# Patient Record
Sex: Male | Born: 1975 | Hispanic: No | Marital: Married | Smoking: Never smoker
Health system: Southern US, Community
[De-identification: ages and names within clinical notes are randomized; demographics above are authoritative.]

## PROBLEM LIST (undated history)

## (undated) DIAGNOSIS — E119 Type 2 diabetes mellitus without complications: Secondary | ICD-10-CM

## (undated) DIAGNOSIS — K219 Gastro-esophageal reflux disease without esophagitis: Secondary | ICD-10-CM

## (undated) DIAGNOSIS — I10 Essential (primary) hypertension: Secondary | ICD-10-CM

## (undated) DIAGNOSIS — E785 Hyperlipidemia, unspecified: Secondary | ICD-10-CM

## (undated) HISTORY — DX: Essential (primary) hypertension: I10

## (undated) HISTORY — DX: Gastro-esophageal reflux disease without esophagitis: K21.9

## (undated) HISTORY — DX: Type 2 diabetes mellitus without complications: E11.9

## (undated) HISTORY — DX: Hyperlipidemia, unspecified: E78.5

---

## 2012-12-05 ENCOUNTER — Emergency Department (INDEPENDENT_AMBULATORY_CARE_PROVIDER_SITE_OTHER)
Admission: EM | Admit: 2012-12-05 | Discharge: 2012-12-05 | Disposition: A | Discharge status: Home / Self Care | Payer: Self-pay | Source: Home / Self Care | Attending: Family Medicine | Admitting: Family Medicine

## 2012-12-05 ENCOUNTER — Encounter (HOSPITAL_COMMUNITY): Payer: Self-pay | Admitting: *Deleted

## 2012-12-05 DIAGNOSIS — K047 Periapical abscess without sinus: Secondary | ICD-10-CM

## 2012-12-05 MED ORDER — CLINDAMYCIN HCL 300 MG PO CAPS: 300.0000 mg | ORAL_CAPSULE | Freq: Three times a day (TID) | ORAL | Status: DC

## 2012-12-05 MED ORDER — DICLOFENAC POTASSIUM 50 MG PO TABS: 50.0000 mg | ORAL_TABLET | Freq: Three times a day (TID) | ORAL | Status: DC

## 2012-12-05 NOTE — ED Notes (Signed)
Pt  Reports  r  Lower  Gum  Swelling  For  2  Days            With   Toothache         Pt  Reports  Symptoms  Not  releived  By otc  meds    Sitting upright on  Exam table  Speaking in  Complete  sentances

## 2012-12-05 NOTE — ED Provider Notes (Signed)
  CSN: [id number]     Arrival date & time 12/05/12  1010 History     First MD Initiated Contact with Patient 12/05/12 1019     Chief Complaint  Patient presents with  . Dental Pain   (Consider location/radiation/quality/duration/timing/severity/associated sxs/prior Treatment) Patient is a 37 y.o. male presenting with tooth pain. The history is provided by the patient.  Dental Pain Location:  Lower Lower teeth location:  29/RL 2nd bicuspid Quality:  Throbbing   History reviewed. No pertinent past medical history. History reviewed. No pertinent past surgical history. History reviewed. No pertinent family history. History  Substance Use Topics  . Smoking status: Never Smoker   . Smokeless tobacco: Not on file  . Alcohol Use: No    Review of Systems  Constitutional: Negative.   HENT: Positive for dental problem.     Allergies  Review of patient's allergies indicates no known allergies.  Home Medications   Current Outpatient Rx  Name  Route  Sig  Dispense  Refill  . Naproxen Sodium (ALEVE PO)   Oral   Take by mouth.         . clindamycin (CLEOCIN) 300 MG capsule   Oral   Take 1 capsule (300 mg total) by mouth 3 (three) times daily.   28 capsule   0   . diclofenac (CATAFLAM) 50 MG tablet   Oral   Take 1 tablet (50 mg total) by mouth 3 (three) times daily.   30 tablet   0    BP 123/81  Pulse 73  Temp(Src) 98.6 F (37 C) (Oral)  Resp 16  SpO2 98% Physical Exam  Nursing note and vitals reviewed. Constitutional: He is oriented to person, place, and time. He appears well-developed and well-nourished.  HENT:  Right Ear: External ear normal.  Left Ear: External ear normal.  Mouth/Throat: Oropharynx is clear and moist and mucous membranes are normal.    Neck: Normal range of motion. Neck supple.  Lymphadenopathy:    He has no cervical adenopathy.  Neurological: He is alert and oriented to person, place, and time.  Skin: Skin is warm and dry.    ED  Course   Procedures (including critical care time)  Labs Reviewed - No data to display No results found. 1. Dental abscess     MDM    Linna Hoff, MD 12/05/12 1045

## 2018-06-27 DIAGNOSIS — E785 Hyperlipidemia, unspecified: Secondary | ICD-10-CM | POA: Insufficient documentation

## 2018-06-27 DIAGNOSIS — E119 Type 2 diabetes mellitus without complications: Secondary | ICD-10-CM | POA: Insufficient documentation

## 2018-06-27 DIAGNOSIS — K429 Umbilical hernia without obstruction or gangrene: Secondary | ICD-10-CM | POA: Insufficient documentation

## 2018-10-13 DIAGNOSIS — R809 Proteinuria, unspecified: Secondary | ICD-10-CM | POA: Insufficient documentation

## 2018-12-15 DIAGNOSIS — K219 Gastro-esophageal reflux disease without esophagitis: Secondary | ICD-10-CM | POA: Insufficient documentation

## 2018-12-15 DIAGNOSIS — R112 Nausea with vomiting, unspecified: Secondary | ICD-10-CM | POA: Insufficient documentation

## 2019-03-05 DIAGNOSIS — Z8619 Personal history of other infectious and parasitic diseases: Secondary | ICD-10-CM | POA: Insufficient documentation

## 2019-03-05 DIAGNOSIS — R1013 Epigastric pain: Secondary | ICD-10-CM | POA: Insufficient documentation

## 2019-03-07 ENCOUNTER — Encounter: Payer: Self-pay | Admitting: Gastroenterology

## 2019-03-09 ENCOUNTER — Emergency Department (HOSPITAL_COMMUNITY): Payer: 59

## 2019-03-09 ENCOUNTER — Other Ambulatory Visit: Payer: Self-pay

## 2019-03-09 ENCOUNTER — Emergency Department (HOSPITAL_COMMUNITY)
Admission: EM | Admit: 2019-03-09 | Discharge: 2019-03-09 | Disposition: A | Discharge status: Home / Self Care | Payer: 59 | Attending: Emergency Medicine | Admitting: Emergency Medicine

## 2019-03-09 DIAGNOSIS — Z79899 Other long term (current) drug therapy: Secondary | ICD-10-CM | POA: Diagnosis not present

## 2019-03-09 DIAGNOSIS — R112 Nausea with vomiting, unspecified: Secondary | ICD-10-CM | POA: Insufficient documentation

## 2019-03-09 DIAGNOSIS — J189 Pneumonia, unspecified organism: Secondary | ICD-10-CM

## 2019-03-09 DIAGNOSIS — R197 Diarrhea, unspecified: Secondary | ICD-10-CM | POA: Insufficient documentation

## 2019-03-09 DIAGNOSIS — U071 COVID-19: Secondary | ICD-10-CM | POA: Diagnosis not present

## 2019-03-09 DIAGNOSIS — R1013 Epigastric pain: Secondary | ICD-10-CM | POA: Diagnosis present

## 2019-03-09 DIAGNOSIS — R10813 Right lower quadrant abdominal tenderness: Secondary | ICD-10-CM | POA: Diagnosis not present

## 2019-03-09 LAB — CBC
HCT: 41 % (ref 39.0–52.0)
Hemoglobin: 14.4 g/dL (ref 13.0–17.0)
MCH: 29.4 pg (ref 26.0–34.0)
MCHC: 35.1 g/dL (ref 30.0–36.0)
MCV: 83.8 fL (ref 80.0–100.0)
Platelets: 232 10*3/uL (ref 150–400)
RBC: 4.89 MIL/uL (ref 4.22–5.81)
RDW: 11.8 % (ref 11.5–15.5)
WBC: 6.3 10*3/uL (ref 4.0–10.5)
nRBC: 0 % (ref 0.0–0.2)

## 2019-03-09 LAB — COMPREHENSIVE METABOLIC PANEL
ALT: 15 U/L (ref 0–44)
AST: 27 U/L (ref 15–41)
Albumin: 3.3 g/dL — ABNORMAL LOW (ref 3.5–5.0)
Alkaline Phosphatase: 71 U/L (ref 38–126)
Anion gap: 16 — ABNORMAL HIGH (ref 5–15)
BUN: 12 mg/dL (ref 6–20)
CO2: 19 mmol/L — ABNORMAL LOW (ref 22–32)
Calcium: 8.5 mg/dL — ABNORMAL LOW (ref 8.9–10.3)
Chloride: 98 mmol/L (ref 98–111)
Creatinine, Ser: 0.76 mg/dL (ref 0.61–1.24)
GFR calc Af Amer: >60 mL/min (ref >60)
GFR calc non Af Amer: >60 mL/min (ref >60)
Glucose, Bld: 177 mg/dL — ABNORMAL HIGH (ref 70–99)
Potassium: 3.7 mmol/L (ref 3.5–5.1)
Sodium: 133 mmol/L — ABNORMAL LOW (ref 135–145)
Total Bilirubin: 0.9 mg/dL (ref 0.3–1.2)
Total Protein: 7 g/dL (ref 6.5–8.1)

## 2019-03-09 LAB — URINALYSIS, ROUTINE W REFLEX MICROSCOPIC
Bilirubin Urine: NEGATIVE
Glucose, UA: >=500 mg/dL — AB
Hgb urine dipstick: NEGATIVE
Ketones, ur: 20 mg/dL — AB
Leukocytes,Ua: NEGATIVE
Nitrite: NEGATIVE
Protein, ur: 100 mg/dL — AB
Specific Gravity, Urine: 1.028 (ref 1.005–1.030)
pH: 6 (ref 5.0–8.0)

## 2019-03-09 LAB — LIPASE, BLOOD: Lipase: 21 U/L (ref 11–51)

## 2019-03-09 LAB — SARS CORONAVIRUS 2 (TAT 6-24 HRS): SARS Coronavirus 2: POSITIVE — AB

## 2019-03-09 MED ORDER — AZITHROMYCIN 250 MG PO TABS: ORAL_TABLET | ORAL | 0 refills | Status: DC

## 2019-03-09 MED ORDER — IOHEXOL 300 MG/ML  SOLN
100.0000 mL | Freq: Once | INTRAMUSCULAR | Status: AC | PRN
  Administered 2019-03-09: 100 mL via INTRAVENOUS

## 2019-03-09 NOTE — ED Provider Notes (Signed)
[facility] EMERGENCY DEPARTMENT Provider Note   CSN: [id number] Arrival date & time: 03/09/19  6073     History   Chief Complaint Chief Complaint  Patient presents with   Abdominal Pain    HPI Jack Watson is a 43 y.o. male.     The history is provided by the patient. No language interpreter was used.  Abdominal Pain Pain location:  Epigastric Pain quality: aching and stabbing   Pain radiates to:  Does not radiate Pain severity:  Moderate Onset quality:  Gradual Duration:  3 days Timing:  Constant Progression:  Worsening Chronicity:  New Context: not alcohol use, not previous surgeries and not sick contacts   Relieved by:  Nothing Worsened by:  Nothing Ineffective treatments:  None tried Associated symptoms: diarrhea and vomiting   Risk factors: no alcohol abuse     No past medical history on file.  There are no active problems to display for this patient.   No past surgical history on file.      Home Medications    Prior to Admission medications   Medication Sig Start Date End Date Taking? Authorizing Provider  azithromycin (ZITHROMAX Z-PAK) 250 MG tablet Two tablets 1st day then 1 tablet a day 03/09/19   Elson Areas, PA-C  clindamycin (CLEOCIN) 300 MG capsule Take 1 capsule (300 mg total) by mouth 3 (three) times daily. 12/05/12   Linna Hoff, MD  diclofenac (CATAFLAM) 50 MG tablet Take 1 tablet (50 mg total) by mouth 3 (three) times daily. 12/05/12   Linna Hoff, MD  Naproxen Sodium (ALEVE PO) Take by mouth.    [provider]    Family History No family history on file.  Social History Social History   Tobacco Use   Smoking status: Never Smoker  Substance Use Topics   Alcohol use: No   Drug use: Not on file     Allergies   Patient has no known allergies.   Review of Systems Review of Systems  Gastrointestinal: Positive for abdominal pain, diarrhea and vomiting.  All other systems reviewed  and are negative.    Physical Exam Updated Vital Signs BP 131/87    Pulse 88    Temp 98.5 F (36.9 C) (Oral)    Resp 14    SpO2 99%   Physical Exam Vitals signs and nursing note reviewed.  Constitutional:      Appearance: He is well-developed.  HENT:     Head: Normocephalic.     Mouth/Throat:     Mouth: Mucous membranes are moist.  Eyes:     Extraocular Movements: Extraocular movements intact.  Neck:     Musculoskeletal: Normal range of motion.  Cardiovascular:     Rate and Rhythm: Normal rate.  Pulmonary:     Effort: Pulmonary effort is normal.  Abdominal:     General: There is no distension.     Palpations: Abdomen is soft.     Tenderness: There is abdominal tenderness in the right lower quadrant.  Musculoskeletal: Normal range of motion.  Skin:    General: Skin is warm.  Neurological:     General: No focal deficit present.     Mental Status: He is alert and oriented to person, place, and time.  Psychiatric:        Mood and Affect: Mood normal.      ED Treatments / Results  Labs (all labs ordered are listed, but only abnormal results are displayed) Labs Reviewed  COMPREHENSIVE METABOLIC PANEL - Abnormal; Notable for the following components:      Result Value   Sodium 133 (*)    CO2 19 (*)    Glucose, Bld 177 (*)    Calcium 8.5 (*)    Albumin 3.3 (*)    Anion gap 16 (*)    All other components within normal limits  URINALYSIS, ROUTINE W REFLEX MICROSCOPIC - Abnormal; Notable for the following components:   Color, Urine AMBER (*)    Glucose, UA >=500 (*)    Ketones, ur 20 (*)    Protein, ur 100 (*)    Bacteria, UA RARE (*)    All other components within normal limits  SARS CORONAVIRUS 2 (TAT 6-24 HRS)  LIPASE, BLOOD  CBC    EKG None  Radiology Ct Abdomen Pelvis W Contrast  Result Date: 03/09/2019 CLINICAL DATA:  Mid abdominal pain for 1 week with nausea and vomiting, diabetes mellitus EXAM: CT ABDOMEN AND PELVIS WITH CONTRAST TECHNIQUE:  Multidetector CT imaging of the abdomen and pelvis was performed using the standard protocol following bolus administration of intravenous contrast. Sagittal and coronal MPR images reconstructed from axial data set. CONTRAST:  100mL OMNIPAQUE IOHEXOL 300 MG/ML SOLN IV. No oral contrast. COMPARISON:  None FINDINGS: Lower chest: Patchy BILATERAL airspace infiltrates in the lower lungs bilaterally consistent with multifocal pneumonia. Additional subsegmental atelectasis RIGHT lower lobe. Hepatobiliary: Gallbladder and liver normal appearance Pancreas: Normal appearance Spleen: Normal appearance Adrenals/Urinary Tract: Adrenal glands, kidneys, ureters, and bladder normal appearance Stomach/Bowel: Normal appendix. Stomach and bowel loops normal appearance. Vascular/Lymphatic: Vascular structures patent.  No adenopathy. Reproductive: Minimal prostatic enlargement. Seminal vesicles unremarkable. Other: Small umbilical hernia containing fat. No free air or free fluid. No intra-abdominal or intrapelvic inflammatory process seen. Musculoskeletal: No acute osseous findings. IMPRESSION: No acute intra-abdominal or intrapelvic abnormalities. Patchy BILATERAL ill-defined airspace infiltrates in the lower lungs bilaterally consistent with multifocal pneumonia; consider atypical and viral etiologies. Findings called to Cheron SchaumannLeslie Constance Hackenberg PA on 03/09/2019 at 1255 hours. Electronically Signed   By: Ulyses SouthwardMark  Boles M.D.   On: 03/09/2019 12:55   Dg Chest Port 1 View  Result Date: 03/09/2019 CLINICAL DATA:  COVID EXAM: PORTABLE CHEST 1 VIEW COMPARISON:  None. FINDINGS: Patchy consolidation is present, greatest at the lung bases. No pleural effusion or pneumothorax. Heart size is likely within normal limits for portable technique. No acute osseous abnormality. IMPRESSION: Bilateral opacities suspicious for pneumonia. Electronically Signed   By: Guadlupe SpanishPraneil  Patel M.D.   On: 03/09/2019 13:40    Procedures Procedures (including critical care  time)  Medications Ordered in ED Medications  iohexol (OMNIPAQUE) 300 MG/ML solution 100 mL (100 mLs Intravenous Contrast Given 03/09/19 1219)     Initial Impression / Assessment and Plan / ED Course  I have reviewed the triage vital signs and the nursing notes.  Pertinent labs & imaging results that were available during my care of the patient were reviewed by me and considered in my medical decision making (see chart for details).        MDM  Pct scan shows possible pneumonia,  Portable chest xray shows pneumonia.  Xrays reviewed, interpreted and discussed with pt.  Pt counseled on probable covid infection.   Jack Watson was evaluated in Emergency Department on 03/09/2019 for the symptoms described in the history of present illness. He was evaluated in the context of the global COVID-19 pandemic, which necessitated consideration that the patient might be at risk for infection with the SARS-CoV-2 virus that  causes COVID-19. Institutional protocols and algorithms that pertain to the evaluation of patients at risk for COVID-19 are in a state of rapid change based on information released by regulatory bodies including the CDC and federal and state organizations. These policies and algorithms were followed during the patient's care in the ED.  Final Clinical Impressions(s) / ED Diagnoses   Final diagnoses:  Community acquired pneumonia, unspecified laterality    ED Discharge Orders         Ordered    azithromycin (ZITHROMAX Z-PAK) 250 MG tablet     03/09/19 1401        An After Visit Summary was printed and given to the patient.    Fransico Meadow, [place] 03/09/19 1436    Gareth Morgan, MD 03/10/19 2235

## 2019-03-09 NOTE — Discharge Instructions (Addendum)
Return if any problems.

## 2019-03-09 NOTE — ED Notes (Signed)
Patient transported to CT 

## 2019-03-09 NOTE — ED Triage Notes (Signed)
Pt here for evaluation of one week of upper abdominal pain and vomiting. Pt having yellow vomit and no diarrhea. Pt prescribed protonix and zofran without relief. Trouble keeping any food down.

## 2019-03-09 NOTE — ED Notes (Signed)
Patient verbalizes understanding of discharge instructions . Opportunity for questions and answers were provided . Armband removed by staff ,Pt discharged from ED. W/C  offered at D/C  and Declined W/C at D/C and was escorted to lobby by RN.  

## 2019-03-12 ENCOUNTER — Telehealth (HOSPITAL_COMMUNITY): Payer: Self-pay

## 2019-03-26 ENCOUNTER — Other Ambulatory Visit: Payer: Self-pay

## 2019-03-26 ENCOUNTER — Encounter: Payer: Self-pay | Admitting: Gastroenterology

## 2019-03-26 ENCOUNTER — Ambulatory Visit (INDEPENDENT_AMBULATORY_CARE_PROVIDER_SITE_OTHER): Payer: 59 | Admitting: Gastroenterology

## 2019-03-26 VITALS — BP 100/66 | HR 107 | Temp 98.0°F | Ht 65.0 in | Wt 142.5 lb

## 2019-03-26 DIAGNOSIS — Z800 Family history of malignant neoplasm of digestive organs: Secondary | ICD-10-CM

## 2019-03-26 DIAGNOSIS — K219 Gastro-esophageal reflux disease without esophagitis: Secondary | ICD-10-CM | POA: Diagnosis not present

## 2019-03-26 DIAGNOSIS — R1013 Epigastric pain: Secondary | ICD-10-CM | POA: Diagnosis not present

## 2019-03-26 MED ORDER — PANTOPRAZOLE SODIUM 40 MG PO TBEC: DELAYED_RELEASE_TABLET | ORAL | 3 refills | Status: AC

## 2019-03-26 NOTE — Progress Notes (Signed)
Chief Complaint: Abdominal pain, history of GERD  Referring Provider:     Sandre Kitty, PA-C  HPI:    Jack Watson is a 43 y.o. male with a history of diabetes, dyslipidemia, referred to the [facility] for evaluation of abdominal pain and reflux.  He presents today with his son who assists in translating.  He describes midepigastric pain and n/v which started approximately 4 weeks ago. Was treated Prilosec bid x1 week and antiemetics, with improvement, and has since stopped these. Sxs have continued to improve a bit over last week or so, despite being off PPI therapy.  No pain currently.  No associated diarrhea, constipation, hematochezia. No weight loss.    Reflux symptoms described as sour brash and regurgitation.  Treated with omeprazole 20 mg/day in 11/2018, but cannot recall efficacy.  Stopped taking medication on his own, but again on PPI last month as above x1 week with improvement.  No longer taking any acid suppression therapy.  Back to having near daily reflux symptoms, worse in the morning, but now can occur throughout the day.  No nocturnal symptoms.  No dysphagia.   Symptoms improved with emesis.  Was last seen by his PCM for this issue in 02/2019 and referred to GI.  Hemoglobin A1c 8.3% in 01/2019.  Was seen in the ER for CAP and Covid positive last month, treated with azithromycin.  Normal liver enzymes and renal function.  Normal CBC.  CT abdomen/pelvis essentially normal aside from patchy bilateral lower lung infiltrates. Covid retest last week reportedly negative.   No previous EGD or colonoscopy.   Father deceased from stomach CA 10 years ago.  Otherwise, no known family history of CRC, liver disease, pancreatic disease, or IBD.    Past Medical History:  Diagnosis Date  . Diabetes (HCC)   . Hypertension      History reviewed. No pertinent surgical history. Family History  Problem Relation Age of Onset  . Stomach cancer Paternal  Grandfather   . Colon cancer Neg Hx   . Esophageal cancer Neg Hx    Social History   Tobacco Use  . Smoking status: Never Smoker  . Smokeless tobacco: Never Used  Substance Use Topics  . Alcohol use: Not Currently    Comment: ocassionally  . Drug use: Never   Current Outpatient Medications  Medication Sig Dispense Refill  . lisinopril (ZESTRIL) 2.5 MG tablet Take 2.5 mg by mouth daily.    . metFORMIN (GLUCOPHAGE) 1000 MG tablet Take 1,000 mg by mouth 2 (two) times daily.    . Naproxen Sodium (ALEVE PO) Take by mouth.    . STEGLATRO 5 MG TABS Take 1 tablet by mouth every morning.     No current facility-administered medications for this visit.    No Known Allergies   Review of Systems: All systems reviewed and negative except where noted in HPI.     Physical Exam:    Wt Readings from Last 3 Encounters:  03/26/19 142 lb 8 oz (64.6 kg)    BP 100/66   Pulse (!) 107   Temp 98 F (36.7 C)   Ht 5\' 5"  (1.651 m)   Wt 142 lb 8 oz (64.6 kg)   BMI 23.71 kg/m  Constitutional:  Pleasant, in no acute distress. Psychiatric: Normal mood and affect. Behavior is normal. EENT: Pupils normal.  Conjunctivae are normal. No scleral icterus. Neck supple. No cervical LAD. Cardiovascular: Normal rate,  regular rhythm. No edema Pulmonary/chest: Effort normal and breath sounds normal. No wheezing, rales or rhonchi. Abdominal: Soft, nondistended, nontender. Bowel sounds active throughout. There are no masses palpable. No hepatomegaly. Neurological: Alert and oriented to person place and time. Skin: Skin is warm and dry. No rashes noted.   ASSESSMENT AND PLAN;   1) GERD 2) Regurgitation - Offered EGD- declined - Protonix 40 mg PO BID x4 weeks for diagnostic/therapeutic intent.  If symptoms resolved, plan to transition to 40 mg/day and eventually wean to lowest effective dose -Discussed antireflux lifestyle/dietary modifications  3) FHx of Gastric CA - As above, offered EGD, which he  declined  4) Epigastric pain- resolved now.  Discussed EGD and/or H. pylori testing.  Declined for now   Lavena Bullion, DO, FACG  03/26/2019, 2:40 PM   Cathleen Corti, PA-C

## 2019-03-26 NOTE — Patient Instructions (Signed)
We have sent the following medications to your pharmacy for you to pick up at your convenience: Protonix  It was a pleasure to see you today!  Vito Cirigliano, D.O.  

## 2019-04-17 ENCOUNTER — Telehealth: Payer: Self-pay | Admitting: Gastroenterology

## 2019-04-17 DIAGNOSIS — R112 Nausea with vomiting, unspecified: Secondary | ICD-10-CM

## 2019-04-17 DIAGNOSIS — R1013 Epigastric pain: Secondary | ICD-10-CM

## 2019-04-17 NOTE — Telephone Encounter (Signed)
Unclear if his persistent symptoms are due to under controlled reflux or if he has a primary gastric issue (gastritis, PUD, etc).  Additionally, he had Covid in 02/2019.  A high percentage of Covid patients present with GI symptoms, and is certainly possible that the prolonged course is related to that, there is just not enough data to answer that question clearly.  Either way, given the persistent symptoms despite high-dose PPI, his family history of gastric CA, I think it is reasonable to proceed with EGD at this time (we discussed this in the clinic during his last appointment, and he wanted to give medications a try first).  If he agrees, can schedule for EGD in my next available appointment.  In the meantime, okay to use Zofran 4 mg po prn every 4-6 hours for nausea/vomiting, #30, RF1.  Thanks.

## 2019-04-17 NOTE — Telephone Encounter (Signed)
Pt called to inform that he has continued experiencing vomiting despite the medication that he has been put on. He stated that since beginning his treatment vomiting calmed down a little bit. However, since the past two weeks he has experiencing it intermittently. He said that this morning vomit had a yellowish color. He still has two more week of treatment but states that Dr. Bryan Lemma told him to call if sxs persisted. Pls call him.

## 2019-04-18 NOTE — Telephone Encounter (Signed)
Called and spoke with patient-patient requests a spanish interpreter  Please call this patient and have him scheduled for a previsit appt and and endo appt; please also let him know that I am sending in the RX for the nausea medicine-please have him confirm the pharmacy he wants this sent to and let me know so I can send in the RX; Thank you Bre

## 2019-04-24 MED ORDER — ONDANSETRON HCL 4 MG PO TABS: 4.0000 mg | ORAL_TABLET | ORAL | 1 refills | Status: DC | PRN

## 2019-04-24 NOTE — Telephone Encounter (Signed)
Fyi, Pt called. [facility] uses [facility] on [address]. I scheduled him for egd on 05/23/19 at 8:30am at [facility] and PV on 05/09/19 at 3:30pm.

## 2019-04-24 NOTE — Telephone Encounter (Signed)
RX sent to pharmacy  

## 2019-04-25 ENCOUNTER — Encounter: Payer: Self-pay | Admitting: Gastroenterology

## 2019-05-09 ENCOUNTER — Ambulatory Visit (AMBULATORY_SURGERY_CENTER): Payer: Self-pay | Admitting: *Deleted

## 2019-05-09 ENCOUNTER — Other Ambulatory Visit: Payer: Self-pay

## 2019-05-09 VITALS — Temp 97.5°F | Ht 65.0 in | Wt 154.2 lb

## 2019-05-09 DIAGNOSIS — K219 Gastro-esophageal reflux disease without esophagitis: Secondary | ICD-10-CM

## 2019-05-09 NOTE — Progress Notes (Signed)
Interpreter used today at the [facility] for this pt.  Interpreter's name is- Thayer Ohm, pt's son.  His temperature- 97.6  Pt had covid 19 on 03-09-19- no need for repeat covid test d/t being within the last 3 months  Pt is aware that care partner will wait in the car during procedure; if they feel like they will be too hot or cold to wait in the car; they may wait in the 4 th floor lobby. Patient is aware to bring only one care partner. We want them to wear a mask (we do not have any that we can provide them), practice social distancing, and we will check their temperatures when they get here.  I did remind the patient that their care partner needs to stay in the parking lot the entire time and have a cell phone available, we will call them when the pt is ready for discharge. Patient will wear mask into building.  No egg or soy allergy  No home oxygen use   No trouble moving neck  No medications for weight loss taken  emmi information given  Son states that pt's wife will be with him the day of the procedure and will need an intepretor

## 2019-05-16 ENCOUNTER — Encounter: Payer: Self-pay | Admitting: Gastroenterology

## 2019-05-23 ENCOUNTER — Other Ambulatory Visit: Payer: Self-pay

## 2019-05-23 ENCOUNTER — Ambulatory Visit (AMBULATORY_SURGERY_CENTER): Payer: 59 | Admitting: Gastroenterology

## 2019-05-23 ENCOUNTER — Encounter: Payer: Self-pay | Admitting: Gastroenterology

## 2019-05-23 VITALS — BP 116/72 | HR 72 | Temp 96.6°F | Resp 16 | Ht 65.0 in | Wt 154.0 lb

## 2019-05-23 DIAGNOSIS — K2950 Unspecified chronic gastritis without bleeding: Secondary | ICD-10-CM

## 2019-05-23 DIAGNOSIS — B9681 Helicobacter pylori [H. pylori] as the cause of diseases classified elsewhere: Secondary | ICD-10-CM

## 2019-05-23 DIAGNOSIS — K2970 Gastritis, unspecified, without bleeding: Secondary | ICD-10-CM | POA: Insufficient documentation

## 2019-05-23 DIAGNOSIS — K2990 Gastroduodenitis, unspecified, without bleeding: Secondary | ICD-10-CM | POA: Insufficient documentation

## 2019-05-23 DIAGNOSIS — R112 Nausea with vomiting, unspecified: Secondary | ICD-10-CM

## 2019-05-23 DIAGNOSIS — Z800 Family history of malignant neoplasm of digestive organs: Secondary | ICD-10-CM

## 2019-05-23 DIAGNOSIS — R1011 Right upper quadrant pain: Secondary | ICD-10-CM

## 2019-05-23 DIAGNOSIS — R1013 Epigastric pain: Secondary | ICD-10-CM

## 2019-05-23 DIAGNOSIS — K219 Gastro-esophageal reflux disease without esophagitis: Secondary | ICD-10-CM

## 2019-05-23 MED ORDER — SODIUM CHLORIDE 0.9 % IV SOLN: 500.0000 mL | Freq: Once | INTRAVENOUS | Status: DC

## 2019-05-23 NOTE — Progress Notes (Signed)
Report given to PACU, vss 

## 2019-05-23 NOTE — Progress Notes (Signed)
Called to room to assist during endoscopic procedure.  Patient ID and intended procedure confirmed with present staff. Received instructions for my participation in the procedure from the performing physician.  

## 2019-05-23 NOTE — Progress Notes (Signed)
Attempted to schedule next EGD for 8 weeks but schedule was not open.  Please call patient to schedule.

## 2019-05-23 NOTE — Progress Notes (Signed)
Pt's states no medical or surgical changes since previsit or office visit.  CW vitals, JB temps and AW IV. Marta Col Interpreter assisted with obtaining medical hx and interview.

## 2019-05-23 NOTE — Patient Instructions (Signed)
Await pathology results.  Information on gastritis given to you today.  Resume Protonix at 40 mg twice a day for 8 weeks.  Repeat upper endoscopy in 8 weeks.  YOU HAD AN ENDOSCOPIC PROCEDURE TODAY AT THE [facility]:   Refer to the procedure report that was given to you for any specific questions about what was found during the examination.  If the procedure report does not answer your questions, please call your gastroenterologist to clarify.  If you requested that your care partner not be given the details of your procedure findings, then the procedure report has been included in a sealed envelope for you to review at your convenience later.  YOU SHOULD EXPECT: Some feelings of bloating in the abdomen. Passage of more gas than usual.  Walking can help get rid of the air that was put into your GI tract during the procedure and reduce the bloating. If you had a lower endoscopy (such as a colonoscopy or flexible sigmoidoscopy) you may notice spotting of blood in your stool or on the toilet paper. If you underwent a bowel prep for your procedure, you may not have a normal bowel movement for a few days.  Please Note:  You might notice some irritation and congestion in your nose or some drainage.  This is from the oxygen used during your procedure.  There is no need for concern and it should clear up in a day or so.  SYMPTOMS TO REPORT IMMEDIATELY:     Following upper endoscopy (EGD)  Vomiting of blood or coffee ground material  New chest pain or pain under the shoulder blades  Painful or persistently difficult swallowing  New shortness of breath  Fever of 100F or higher  Black, tarry-looking stools  For urgent or emergent issues, a gastroenterologist can be reached at any hour by calling [phone].   DIET:  We do recommend a small meal at first, but then you may proceed to your regular diet.  Drink plenty of fluids but you should avoid alcoholic beverages for 24  hours.  ACTIVITY:  You should plan to take it easy for the rest of today and you should NOT DRIVE or use heavy machinery until tomorrow (because of the sedation medicines used during the test).    FOLLOW UP: Our staff will call the number listed on your records 48-72 hours following your procedure to check on you and address any questions or concerns that you may have regarding the information given to you following your procedure. If we do not reach you, we will leave a message.  We will attempt to reach you two times.  During this call, we will ask if you have developed any symptoms of COVID 19. If you develop any symptoms (ie: fever, flu-like symptoms, shortness of breath, cough etc.) before then, please call [phone].  If you test positive for Covid 19 in the 2 weeks post procedure, please call and report this information to [place].    If any biopsies were taken you will be contacted by phone or by letter within the next 1-3 weeks.  Please call us at [phone] if you have not heard about the biopsies in 3 weeks.    SIGNATURES/CONFIDENTIALITY: You and/or your care partner have signed paperwork which will be entered into your electronic medical record.  These signatures attest to the fact that that the information above on your After Visit Summary has been reviewed and is understood.  Full responsibility of the confidentiality  of this discharge information lies with you and/or your care-partner.

## 2019-05-23 NOTE — Op Note (Signed)
[facility] Patient Name: Jack Watson Procedure Date: 05/23/2019 8:30 AM MRN: [id number] Endoscopist: Doristine Locks , MD Age: 44 Referring MD:  Date of Birth: 1976/01/09 Gender: Male Account #: [id number] Procedure:                Upper GI endoscopy Indications:              Epigastric abdominal pain, Abdominal pain in the                            right upper quadrant, Suspected esophageal reflux,                            Nausea with vomiting Medicines:                Monitored Anesthesia Care Procedure:                Pre-Anesthesia Assessment:                           - Prior to the procedure, a History and Physical                            was performed, and patient medications and                            allergies were reviewed. The patient's tolerance of                            previous anesthesia was also reviewed. The risks                            and benefits of the procedure and the sedation                            options and risks were discussed with the patient.                            All questions were answered, and informed consent                            was obtained. Prior Anticoagulants: The patient has                            taken no previous anticoagulant or antiplatelet                            agents. ASA Grade Assessment: II - A patient with                            mild systemic disease. After reviewing the risks                            and benefits, the patient was deemed in  satisfactory condition to undergo the procedure.                           After obtaining informed consent, the endoscope was                            passed under direct vision. Throughout the                            procedure, the patient's blood pressure, pulse, and                            oxygen saturations were monitored continuously. The                            Endoscope was introduced through  the mouth, and                            advanced to the second part of duodenum. The upper                            GI endoscopy was accomplished without difficulty.                            The patient tolerated the procedure well. Scope In: Scope Out: Findings:                 The examined esophagus was normal.                           The Z-line was regular and was found 40 cm from the                            incisors.                           Localized moderate inflammation characterized by                            congestion (edema) and erythema was found in the                            gastric fundus. Biopsies were taken with a cold                            forceps for histology. Estimated blood loss was                            minimal.                           Scattered mild inflammation characterized by                            erythema was found in the gastric body and in the  gastric antrum. Biopsies were taken with a cold                            forceps for Helicobacter pylori testing. Estimated                            blood loss was minimal.                           The duodenal bulb, first portion of the duodenum                            and second portion of the duodenum were normal. Complications:            No immediate complications. Estimated Blood Loss:     Estimated blood loss was minimal. Impression:               - Normal esophagus.                           - Z-line regular, 40 cm from the incisors.                           - Gastritis. Biopsied.                           - Gastritis. Biopsied.                           - Normal duodenal bulb, first portion of the                            duodenum and second portion of the duodenum. Recommendation:           - Patient has a contact number available for                            emergencies. The signs and symptoms of potential                             delayed complications were discussed with the                            patient. Return to normal activities tomorrow.                            Written discharge instructions were provided to the                            patient.                           - Resume previous diet.                           - Await pathology results.                           -  Resume Protonix (pantoprazole) at 40 mg PO BID                            for 8 weeks.                           - Repeat upper endoscopy in 8 weeks to check                            healing. Doristine Locks, MD 05/23/2019 8:58:55 AM

## 2019-05-25 ENCOUNTER — Telehealth: Payer: Self-pay | Admitting: *Deleted

## 2019-05-25 NOTE — Telephone Encounter (Signed)
  Follow up Call-  Call back number 05/23/2019  Post procedure Call Back phone  # [phone]  Permission to leave phone message Yes  Some recent data might be hidden     Patient questions:  Do you have a fever, pain , or abdominal swelling? No. Pain Score  0 *  Have you tolerated food without any problems? Yes.    Have you been able to return to your normal activities? Yes.    Do you have any questions about your discharge instructions: Diet   No. Medications  No. Follow up visit  No.  Do you have questions or concerns about your Care? No.  Actions: * If pain score is 4 or above: No action needed, pain <4.  1. Have you developed a fever since your procedure? no  2.   Have you had an respiratory symptoms (SOB or cough) since your procedure? no  3.   Have you tested positive for COVID 19 since your procedure no  4.   Have you had any family members/close contacts diagnosed with the COVID 19 since your procedure?  no   If yes to any of these questions please route to Laverna Peace, RN and Jennye Boroughs, Charity fundraiser.

## 2019-05-28 NOTE — Telephone Encounter (Signed)
Patient returned call and states he is well

## 2019-05-29 ENCOUNTER — Other Ambulatory Visit: Payer: Self-pay | Admitting: Gastroenterology

## 2019-05-29 MED ORDER — DOXYCYCLINE HYCLATE 100 MG PO TABS: 100.0000 mg | ORAL_TABLET | Freq: Two times a day (BID) | ORAL | 0 refills | Status: AC

## 2019-05-29 MED ORDER — OMEPRAZOLE 40 MG PO CPDR: 40.0000 mg | DELAYED_RELEASE_CAPSULE | Freq: Two times a day (BID) | ORAL | 0 refills | Status: AC

## 2019-05-29 MED ORDER — BISMUTH SUBSALICYLATE 262 MG PO CHEW: 524.0000 mg | CHEWABLE_TABLET | Freq: Four times a day (QID) | ORAL | 0 refills | Status: AC

## 2019-05-29 MED ORDER — METRONIDAZOLE 250 MG PO TABS: 250.0000 mg | ORAL_TABLET | Freq: Four times a day (QID) | ORAL | 0 refills | Status: AC

## 2019-06-09 DIAGNOSIS — E1142 Type 2 diabetes mellitus with diabetic polyneuropathy: Secondary | ICD-10-CM | POA: Insufficient documentation

## 2019-06-09 DIAGNOSIS — B353 Tinea pedis: Secondary | ICD-10-CM | POA: Insufficient documentation

## 2019-07-05 ENCOUNTER — Encounter: Payer: Self-pay | Admitting: Gastroenterology

## 2019-07-18 ENCOUNTER — Other Ambulatory Visit: Payer: Self-pay

## 2019-07-18 ENCOUNTER — Ambulatory Visit (AMBULATORY_SURGERY_CENTER): Payer: Self-pay | Admitting: *Deleted

## 2019-07-18 VITALS — Temp 97.8°F | Ht 65.0 in | Wt 154.0 lb

## 2019-07-18 DIAGNOSIS — K2970 Gastritis, unspecified, without bleeding: Secondary | ICD-10-CM

## 2019-07-18 NOTE — Progress Notes (Signed)

## 2019-07-18 NOTE — Progress Notes (Signed)
Patient assisted by interpretor Maretta Los

## 2019-08-01 ENCOUNTER — Encounter: Payer: Self-pay | Admitting: Gastroenterology

## 2019-08-03 ENCOUNTER — Ambulatory Visit (AMBULATORY_SURGERY_CENTER): Payer: 59 | Admitting: Gastroenterology

## 2019-08-03 ENCOUNTER — Encounter: Payer: Self-pay | Admitting: Gastroenterology

## 2019-08-03 ENCOUNTER — Other Ambulatory Visit: Payer: Self-pay

## 2019-08-03 VITALS — BP 100/67 | HR 64 | Temp 96.6°F | Resp 19 | Ht 65.0 in | Wt 154.0 lb

## 2019-08-03 DIAGNOSIS — B9681 Helicobacter pylori [H. pylori] as the cause of diseases classified elsewhere: Secondary | ICD-10-CM | POA: Diagnosis not present

## 2019-08-03 DIAGNOSIS — K2970 Gastritis, unspecified, without bleeding: Secondary | ICD-10-CM

## 2019-08-03 DIAGNOSIS — K2950 Unspecified chronic gastritis without bleeding: Secondary | ICD-10-CM | POA: Diagnosis not present

## 2019-08-03 DIAGNOSIS — K3189 Other diseases of stomach and duodenum: Secondary | ICD-10-CM | POA: Diagnosis not present

## 2019-08-03 DIAGNOSIS — A048 Other specified bacterial intestinal infections: Secondary | ICD-10-CM

## 2019-08-03 DIAGNOSIS — K219 Gastro-esophageal reflux disease without esophagitis: Secondary | ICD-10-CM

## 2019-08-03 DIAGNOSIS — R1013 Epigastric pain: Secondary | ICD-10-CM

## 2019-08-03 HISTORY — PX: UPPER GASTROINTESTINAL ENDOSCOPY: SHX188

## 2019-08-03 MED ORDER — SODIUM CHLORIDE 0.9 % IV SOLN: 500.0000 mL | Freq: Once | INTRAVENOUS | Status: AC

## 2019-08-03 MED ORDER — SODIUM CHLORIDE 0.9 % IV SOLN: 500.0000 mL | Freq: Once | INTRAVENOUS | Status: DC

## 2019-08-03 NOTE — Progress Notes (Signed)
Pt's states no medical or surgical changes since previsit or office visit.  Temp LC Vitals CW 

## 2019-08-03 NOTE — Op Note (Signed)
[facility] Patient Name: Jack Watson Procedure Date: 08/03/2019 10:07 AM MRN: [id number] Endoscopist: Doristine Locks , MD Age: 44 Referring MD:  Date of Birth: [birth year] Gender: Male Account #: [id number] Procedure:                Upper GI endoscopy Indications:              Follow-up of Helicobacter pylori, Follow-up of                            gastritis                           EGD on 05/23/19 with H pylori gastritis and a focal                            area of moderate gastritis in the gastric fundus,                            treated with high dose PPI x8 weeks along with                            quadruple therapy for H pylori eradication,                            presents today for EGD to evaluate for appropriate                            mucosal healing and H pylori eradication. Medicines:                Monitored Anesthesia Care Procedure:                Pre-Anesthesia Assessment:                           - Prior to the procedure, a History and Physical                            was performed, and patient medications and                            allergies were reviewed. The patient's tolerance of                            previous anesthesia was also reviewed. The risks                            and benefits of the procedure and the sedation                            options and risks were discussed with the patient.                            All questions were answered, and informed consent  was obtained. Prior Anticoagulants: The patient has                            taken no previous anticoagulant or antiplatelet                            agents. ASA Grade Assessment: II - A patient with                            mild systemic disease. After reviewing the risks                            and benefits, the patient was deemed in                            satisfactory condition to undergo the procedure.                            After obtaining informed consent, the endoscope was                            passed under direct vision. Throughout the                            procedure, the patient's blood pressure, pulse, and                            oxygen saturations were monitored continuously. The                            Endoscope was introduced through the mouth, and                            advanced to the second part of duodenum. The upper                            GI endoscopy was accomplished without difficulty.                            The patient tolerated the procedure well. Scope In: Scope Out: Findings:                 The examined esophagus was normal.                           The Z-line was regular and was found 36 cm from the                            incisors.                           Localized area of moderate inflammation                            characterized by congestion (edema)  and erythema                            was found in the gastric fundus. This was only                            slightly improved from previous. Multiple mucosal                            biopsies were taken with a cold forceps for                            histology. Estimated blood loss was minimal.                           Scattered pockets of minimal inflammation                            characterized by erythema was found in the gastric                            body and in the gastric antrum. This was improved                            from previous. Biopsies were taken with a cold                            forceps for Helicobacter pylori testing. Estimated                            blood loss was minimal.                           The duodenal bulb, first portion of the duodenum                            and second portion of the duodenum were normal. Complications:            No immediate complications. Estimated Blood Loss:     Estimated blood loss was  minimal. Impression:               - Normal esophagus.                           - Z-line regular, 36 cm from the incisors.                           - Gastritis. Biopsied.                           - Gastritis. Biopsied.                           - Normal duodenal bulb, first portion of the  duodenum and second portion of the duodenum. Recommendation:           - Patient has a contact number available for                            emergencies. The signs and symptoms of potential                            delayed complications were discussed with the                            patient. Return to normal activities tomorrow.                            Written discharge instructions were provided to the                            patient.                           - Resume previous diet.                           - Continue present medications.                           - Await pathology results.                           - Return to GI clinic in 6 weeks. Gerrit Heck, MD 08/03/2019 10:49:38 AM

## 2019-08-03 NOTE — Progress Notes (Signed)
Called to room to assist during endoscopic procedure.  Patient ID and intended procedure confirmed with present staff. Received instructions for my participation in the procedure from the performing physician.  

## 2019-08-03 NOTE — Progress Notes (Signed)
PT taken to PACU. Monitors in place. VSS. Report given to RN. 

## 2019-08-03 NOTE — Patient Instructions (Signed)
YOU HAD AN ENDOSCOPIC PROCEDURE TODAY AT THE [facility]:   Refer to the procedure report that was given to you for any specific questions about what was found during the examination.  If the procedure report does not answer your questions, please call your gastroenterologist to clarify.  If you requested that your care partner not be given the details of your procedure findings, then the procedure report has been included in a sealed envelope for you to review at your convenience later.  YOU SHOULD EXPECT: Some feelings of bloating in the abdomen. Passage of more gas than usual.  Walking can help get rid of the air that was put into your GI tract during the procedure and reduce the bloating. If you had a lower endoscopy (such as a colonoscopy or flexible sigmoidoscopy) you may notice spotting of blood in your stool or on the toilet paper. If you underwent a bowel prep for your procedure, you may not have a normal bowel movement for a few days.  Please Note:  You might notice some irritation and congestion in your nose or some drainage.  This is from the oxygen used during your procedure.  There is no need for concern and it should clear up in a day or so.  SYMPTOMS TO REPORT IMMEDIATELY:     Following upper endoscopy (EGD)  Vomiting of blood or coffee ground material  New chest pain or pain under the shoulder blades  Painful or persistently difficult swallowing  New shortness of breath  Fever of 100F or higher  Black, tarry-looking stools  For urgent or emergent issues, a gastroenterologist can be reached at any hour by calling [phone]. Do not use MyChart messaging for urgent concerns.    DIET:  We do recommend a small meal at first, but then you may proceed to your regular diet.  Drink plenty of fluids but you should avoid alcoholic beverages for 24 hours.  ACTIVITY:  You should plan to take it easy for the rest of today and you should NOT DRIVE or use heavy machinery  until tomorrow (because of the sedation medicines used during the test).    FOLLOW UP: Our staff will call the number listed on your records 48-72 hours following your procedure to check on you and address any questions or concerns that you may have regarding the information given to you following your procedure. If we do not reach you, we will leave a message.  We will attempt to reach you two times.  During this call, we will ask if you have developed any symptoms of COVID 19. If you develop any symptoms (ie: fever, flu-like symptoms, shortness of breath, cough etc.) before then, please call [phone].  If you test positive for Covid 19 in the 2 weeks post procedure, please call and report this information to [place].    If any biopsies were taken you will be contacted by phone or by letter within the next 1-3 weeks.  Please call us at [phone] if you have not heard about the biopsies in 3 weeks.    SIGNATURES/CONFIDENTIALITY: You and/or your care partner have signed paperwork which will be entered into your electronic medical record.  These signatures attest to the fact that that the information above on your After Visit Summary has been reviewed and is understood.  Full responsibility of the confidentiality of this discharge information lies with you and/or your care-partner.  Resume medications. Follow up in clinic in 6 weeks.

## 2019-08-07 ENCOUNTER — Telehealth: Payer: Self-pay

## 2019-08-07 NOTE — Telephone Encounter (Signed)
Left message on answering machine. 

## 2019-08-07 NOTE — Telephone Encounter (Signed)
Attempted to reach patient for post-procedure f/u call. No answer. Left message for him to please not hesitate to call us if he has any questions/concerns regarding his care. 

## 2019-08-13 ENCOUNTER — Other Ambulatory Visit: Payer: Self-pay | Admitting: Gastroenterology

## 2019-08-13 DIAGNOSIS — B9681 Helicobacter pylori [H. pylori] as the cause of diseases classified elsewhere: Secondary | ICD-10-CM

## 2019-08-13 MED ORDER — OMEPRAZOLE 20 MG PO CPDR: 20.0000 mg | DELAYED_RELEASE_CAPSULE | Freq: Two times a day (BID) | ORAL | 0 refills | Status: AC

## 2019-08-13 MED ORDER — METRONIDAZOLE 500 MG PO TABS
500.0000 mg | ORAL_TABLET | Freq: Two times a day (BID) | ORAL | 0 refills | Status: AC
Start: 2019-08-13 — End: ?

## 2019-08-13 MED ORDER — CLARITHROMYCIN 500 MG PO TABS: 500.0000 mg | ORAL_TABLET | Freq: Two times a day (BID) | ORAL | 0 refills | Status: AC

## 2019-08-13 MED ORDER — AMOXICILLIN 500 MG PO CAPS
1000.0000 mg | ORAL_CAPSULE | Freq: Three times a day (TID) | ORAL | 0 refills | Status: AC
Start: 2019-08-13 — End: ?

## 2019-08-17 ENCOUNTER — Telehealth: Payer: Self-pay | Admitting: Gastroenterology

## 2019-08-17 NOTE — Telephone Encounter (Signed)
Jack Watson will try to contact patient to ask him a few questions regarding how he is taking his medication

## 2019-08-17 NOTE — Telephone Encounter (Signed)
Jack Watson spoke to patient who states that the nauseawith taking H Pylori medication is improving no vomiting today no diarrhea. We will contact him on Monday to see how he is tolerating medication. He understands to stop taking the medication over the weekend if symptoms return.

## 2019-08-17 NOTE — Telephone Encounter (Signed)
Pt called to report that he began taking medication for H-Pylori treatment last Wednesday. He began experiencing vomiting 3 hours after taking medications. He reported that he was vomiting a yellowish fluid. He also reported having diarrhea yesterday. Pt wants to know if this reaction to treatment is normal.

## 2020-07-18 ENCOUNTER — Other Ambulatory Visit: Payer: Self-pay

## 2020-07-18 ENCOUNTER — Emergency Department (HOSPITAL_COMMUNITY)
Admission: EM | Admit: 2020-07-18 | Discharge: 2020-07-19 | Disposition: A | Discharge status: Home / Self Care | Payer: 59 | Attending: Emergency Medicine | Admitting: Emergency Medicine

## 2020-07-18 DIAGNOSIS — S90421A Blister (nonthermal), right great toe, initial encounter: Secondary | ICD-10-CM | POA: Insufficient documentation

## 2020-07-18 DIAGNOSIS — L97519 Non-pressure chronic ulcer of other part of right foot with unspecified severity: Secondary | ICD-10-CM | POA: Diagnosis not present

## 2020-07-18 DIAGNOSIS — E08621 Diabetes mellitus due to underlying condition with foot ulcer: Secondary | ICD-10-CM | POA: Insufficient documentation

## 2020-07-18 DIAGNOSIS — X58XXXA Exposure to other specified factors, initial encounter: Secondary | ICD-10-CM | POA: Diagnosis not present

## 2020-07-18 DIAGNOSIS — I10 Essential (primary) hypertension: Secondary | ICD-10-CM | POA: Diagnosis not present

## 2020-07-18 DIAGNOSIS — S90829A Blister (nonthermal), unspecified foot, initial encounter: Secondary | ICD-10-CM

## 2020-07-18 DIAGNOSIS — S90422A Blister (nonthermal), left great toe, initial encounter: Secondary | ICD-10-CM | POA: Insufficient documentation

## 2020-07-18 DIAGNOSIS — Z79899 Other long term (current) drug therapy: Secondary | ICD-10-CM | POA: Diagnosis not present

## 2020-07-18 DIAGNOSIS — Z7984 Long term (current) use of oral hypoglycemic drugs: Secondary | ICD-10-CM | POA: Insufficient documentation

## 2020-07-18 DIAGNOSIS — L97529 Non-pressure chronic ulcer of other part of left foot with unspecified severity: Secondary | ICD-10-CM | POA: Insufficient documentation

## 2020-07-18 LAB — COMPREHENSIVE METABOLIC PANEL
ALT: 27 U/L (ref 0–44)
AST: 23 U/L (ref 15–41)
Albumin: 3.8 g/dL (ref 3.5–5.0)
Alkaline Phosphatase: 76 U/L (ref 38–126)
Anion gap: 7 (ref 5–15)
BUN: 21 mg/dL — ABNORMAL HIGH (ref 6–20)
CO2: 23 mmol/L (ref 22–32)
Calcium: 9.1 mg/dL (ref 8.9–10.3)
Chloride: 107 mmol/L (ref 98–111)
Creatinine, Ser: 0.91 mg/dL (ref 0.61–1.24)
GFR, Estimated: >60 mL/min (ref >60)
Glucose, Bld: 274 mg/dL — ABNORMAL HIGH (ref 70–99)
Potassium: 4 mmol/L (ref 3.5–5.1)
Sodium: 137 mmol/L (ref 135–145)
Total Bilirubin: 0.7 mg/dL (ref 0.3–1.2)
Total Protein: 7.1 g/dL (ref 6.5–8.1)

## 2020-07-18 LAB — URINALYSIS, ROUTINE W REFLEX MICROSCOPIC
Bacteria, UA: NONE SEEN
Bilirubin Urine: NEGATIVE
Glucose, UA: >=500 mg/dL — AB
Hgb urine dipstick: NEGATIVE
Ketones, ur: NEGATIVE mg/dL
Leukocytes,Ua: NEGATIVE
Nitrite: NEGATIVE
Protein, ur: NEGATIVE mg/dL
Specific Gravity, Urine: 1.031 — ABNORMAL HIGH (ref 1.005–1.030)
pH: 5 (ref 5.0–8.0)

## 2020-07-18 LAB — CBC WITH DIFFERENTIAL/PLATELET
Abs Immature Granulocytes: 0.02 10*3/uL (ref 0.00–0.07)
Basophils Absolute: 0.1 10*3/uL (ref 0.0–0.1)
Basophils Relative: 2 %
Eosinophils Absolute: 0.2 10*3/uL (ref 0.0–0.5)
Eosinophils Relative: 4 %
HCT: 43.7 % (ref 39.0–52.0)
Hemoglobin: 14.6 g/dL (ref 13.0–17.0)
Immature Granulocytes: 0 %
Lymphocytes Relative: 30 %
Lymphs Abs: 1.9 10*3/uL (ref 0.7–4.0)
MCH: 28.5 pg (ref 26.0–34.0)
MCHC: 33.4 g/dL (ref 30.0–36.0)
MCV: 85.2 fL (ref 80.0–100.0)
Monocytes Absolute: 0.5 10*3/uL (ref 0.1–1.0)
Monocytes Relative: 8 %
Neutro Abs: 3.6 10*3/uL (ref 1.7–7.7)
Neutrophils Relative %: 56 %
Platelets: 263 10*3/uL (ref 150–400)
RBC: 5.13 MIL/uL (ref 4.22–5.81)
RDW: 12.9 % (ref 11.5–15.5)
WBC: 6.4 10*3/uL (ref 4.0–10.5)
nRBC: 0 % (ref 0.0–0.2)

## 2020-07-18 NOTE — ED Triage Notes (Signed)
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

## 2020-07-18 NOTE — ED Provider Notes (Signed)
MSE was initiated and I personally evaluated the patient and placed orders (if any) at  10:17 PM on July 18, 2020.  The patient appears stable so that the remainder of the MSE may be completed by another provider.  Ulceration noted to bilateral feet most significant to great toes. Associated bulla involving the skin of the left first and second toe.   Fayrene Helper, PA-C 07/18/20 2220    Rolan Bucco, MD 07/18/20 2223

## 2020-07-19 ENCOUNTER — Emergency Department (HOSPITAL_COMMUNITY): Payer: 59

## 2020-07-19 MED ORDER — MUPIROCIN 2 % EX OINT: TOPICAL_OINTMENT | Freq: Three times a day (TID) | CUTANEOUS | 0 refills | Status: AC

## 2020-07-19 MED ORDER — BACITRACIN ZINC 500 UNIT/GM EX OINT
TOPICAL_OINTMENT | Freq: Once | CUTANEOUS | Status: AC
  Administered 2020-07-19: 1 via TOPICAL
  Filled 2020-07-19: qty 0.9

## 2020-07-19 MED ORDER — CEPHALEXIN 500 MG PO CAPS: 500.0000 mg | ORAL_CAPSULE | Freq: Four times a day (QID) | ORAL | 0 refills | Status: AC

## 2020-07-19 NOTE — ED Provider Notes (Signed)
[facility] EMERGENCY DEPARTMENT Provider Note   CSN: [id number] Arrival date & time: 07/18/20  1947     History No chief complaint on file.   Jack Watson is a 45 y.o. male possible history of diabetes, GERD, hyperlipidemia, hypertension who presents for evaluation of sores noted to his bilateral feet.  He states that they have been ongoing for about a week.  Yesterday he noticed when he pulled his sock off in the morning, he had drainage from wound sites, prompting visit to his primary care doctor.  He was seen by his PCP and advised to go to the emergency department for further evaluation of his wounds.  He denies any trauma, injury.  He states that initially on his right foot, he started having blisters and then started to develop into an ulcer on the bottom of his first toe.  He has now developed blisters to his left foot.  He states they are slightly painful, mostly when he walks.  He has not any fevers.  He reports that occasionally, he gets some numbness to his feet but states that is typical after his diabetes.  He denies any new numbness/weakness.  Denies any fevers.  He states he takes his blood sugar medication consistently. He denies any abd pain, CP, SOB.   The history is provided by the patient. A language interpreter was used.       Past Medical History:  Diagnosis Date  . Diabetes (HCC)   . GERD (gastroesophageal reflux disease)   . Hyperlipidemia   . Hypertension     There are no problems to display for this patient.   Past Surgical History:  Procedure Laterality Date  . UPPER GASTROINTESTINAL ENDOSCOPY  08/03/2019   05/2019       Family History  Problem Relation Age of Onset  . Stomach cancer Paternal Grandfather   . Stomach cancer Father   . Colon cancer Neg Hx   . Rectal cancer Neg Hx   . Esophageal cancer Neg Hx     Social History   Tobacco Use  . Smoking status: Never Smoker  . Smokeless tobacco: Never Used  Vaping Use  .  Vaping Use: Never used  Substance Use Topics  . Alcohol use: Not Currently    Comment: ocassionally  . Drug use: Never    Home Medications Prior to Admission medications   Medication Sig Start Date End Date Taking? Authorizing Provider  cephALEXin (KEFLEX) 500 MG capsule Take 1 capsule (500 mg total) by mouth 4 (four) times daily for 7 days. 07/19/20 07/26/20 Yes Maxwell Caul, PA-C  mupirocin ointment (BACTROBAN) 2 % Apply topically 3 (three) times daily. 07/19/20  Yes Graciella Freer A, PA-C  amoxicillin (AMOXIL) 500 MG capsule Take 2 capsules (1,000 mg total) by mouth 3 (three) times daily. 08/13/19   Cirigliano, Vito V, DO  clarithromycin (BIAXIN) 500 MG tablet Take 1 tablet (500 mg total) by mouth 2 (two) times daily. 08/13/19   Cirigliano, Vito V, DO  lisinopril (ZESTRIL) 2.5 MG tablet Take 2.5 mg by mouth daily. 03/16/19   [provider]  lovastatin (MEVACOR) 40 MG tablet Take 40 mg by mouth daily. 07/18/19   [provider]  metFORMIN (GLUCOPHAGE) 1000 MG tablet Take 1,000 mg by mouth 2 (two) times daily. 01/01/19   [provider]  metroNIDAZOLE (FLAGYL) 500 MG tablet Take 1 tablet (500 mg total) by mouth 2 (two) times daily. 08/13/19   Cirigliano, Vito V, DO  omeprazole (  PRILOSEC) 20 MG capsule Take 1 capsule (20 mg total) by mouth 2 (two) times daily. 08/13/19   Cirigliano, Vito V, DO  omeprazole (PRILOSEC) 40 MG capsule Take 1 capsule (40 mg total) by mouth 2 (two) times daily for 14 days. 05/29/19 06/12/19  Cirigliano, Vito V, DO  pantoprazole (PROTONIX) 40 MG tablet Take 40 mg twice daily for 4 weeks then take 40 mg every morning Patient not taking: Reported on 05/09/2019 03/26/19   Cirigliano, Verlin Dike, DO    Allergies    Patient has no known allergies.  Review of Systems   Review of Systems  Constitutional: Negative for fever.  Respiratory: Negative for cough and shortness of breath.   Cardiovascular: Negative for chest pain.  Gastrointestinal: Negative  for abdominal pain, nausea and vomiting.  Skin: Positive for color change and wound.  Neurological: Negative for weakness and numbness.  All other systems reviewed and are negative.   Physical Exam Updated Vital Signs BP (!) 124/91 (BP Location: Right Arm)   Pulse 80   Temp 98.1 F (36.7 C) (Oral)   Resp 17   SpO2 99%   Physical Exam Vitals and nursing note reviewed.  Constitutional:      Appearance: Normal appearance. He is well-developed.     Comments: Sitting comfortably on examination table  HENT:     Head: Normocephalic and atraumatic.  Eyes:     General: Lids are normal.     Conjunctiva/sclera: Conjunctivae normal.     Pupils: Pupils are equal, round, and reactive to light.  Cardiovascular:     Rate and Rhythm: Normal rate and regular rhythm.     Pulses: Normal pulses.          Dorsalis pedis pulses are 2+ on the right side and 2+ on the left side.     Heart sounds: Normal heart sounds. No murmur heard. No friction rub. No gallop.   Pulmonary:     Effort: Pulmonary effort is normal.     Breath sounds: Normal breath sounds.  Abdominal:     Palpations: Abdomen is soft. Abdomen is not rigid.     Tenderness: There is no abdominal tenderness. There is no guarding.  Musculoskeletal:        General: Normal range of motion.     Cervical back: Full passive range of motion without pain.     Comments: Dorsiflexion and plantarflexion of bilateral feet intact any difficulty.  He can fully flex and extend all 5 digits of both feet.  No deformity, crepitus noted.  Skin:    General: Skin is warm and dry.     Capillary Refill: Capillary refill takes less than 2 seconds.     Comments: He has ulceration noted to the plantar surface of the right first toe.  The left foot has an area of erythema noted to the dorsal aspect that in total is about 4 x 2 cm.  He has bulla noted to the left first toe as well as the second toe. Good distal cap refill. BLE is not dusky in appearance or cool to  touch. Negative Nikolsky sign.   Neurological:     Mental Status: He is alert and oriented to person, place, and time.     Comments: Sensation intact along major nerve distributions of BLE  Psychiatric:        Speech: Speech normal.              ED Results / Procedures / Treatments   Labs (  all labs ordered are listed, but only abnormal results are displayed) Labs Reviewed  COMPREHENSIVE METABOLIC PANEL - Abnormal; Notable for the following components:      Result Value   Glucose, Bld 274 (*)    BUN 21 (*)    All other components within normal limits  URINALYSIS, ROUTINE W REFLEX MICROSCOPIC - Abnormal; Notable for the following components:   Color, Urine STRAW (*)    Specific Gravity, Urine 1.031 (*)    Glucose, UA >=500 (*)    All other components within normal limits  CBC WITH DIFFERENTIAL/PLATELET    EKG None  Radiology DG Foot Complete Left  Result Date: 07/19/2020 CLINICAL DATA:  Diabetic foot ulcer EXAM: LEFT FOOT - COMPLETE 3+ VIEW COMPARISON:  None. FINDINGS: The patient's ulcer is not clearly visualized. No opaque foreign body or erosion. Premature arterial calcification correlating with history of diabetes. IMPRESSION: No acute finding. No opaque foreign body or evidence of osteomyelitis. Electronically Signed   By: Marnee Spring M.D.   On: 07/19/2020 09:22   DG Foot Complete Right  Result Date: 07/19/2020 CLINICAL DATA:  Diabetic foot ulcer EXAM: RIGHT FOOT COMPLETE - 3+ VIEW COMPARISON:  None. FINDINGS: The patient's ulcer is not clearly visualized. No evidence of osteomyelitis, fracture, or necrotizing soft tissue infection. Premature arterial calcification correlating with the history. IMPRESSION: No acute finding. Electronically Signed   By: Marnee Spring M.D.   On: 07/19/2020 09:23    Procedures Procedures   Medications Ordered in ED Medications  bacitracin ointment (1 application Topical Given by Other 07/19/20 1112)    ED Course  I have  reviewed the triage vital signs and the nursing notes.  Pertinent labs & imaging results that were available during my care of the patient were reviewed by me and considered in my medical decision making (see chart for details).    MDM Rules/Calculators/A&P                          45 year old male past x-ray of diabetes who presents for evaluation of wounds to bilateral feet x1 week.  Saw his PCP yesterday and was sent over to the ED for further evaluation.  No fevers.  He has noted some drainage.  On initial arrival, he is afebrile, toxic appearing.  Vital signs are stable.  He is neurovascularly intact.  On exam, he has ulcer noted to plantar surface of the right first toe.  He has some areas of erythema noted to the dorsal aspect of the left foot.  His left first and second toe also have evidence of bulla/blister noted.  Lab work ordered at triage.  Will obtain x-ray imaging to ensure no bony abnormality.  CMP shows BUN 21, creatinine 0.91.  Glucose 274.  UA negative.  CBC shows no leukocytosis.  X-rays negative bilaterally for any acute abnormality.  Using the language interpreter, I discussed the results with patient.  He tells me he has not had any burns or been exposed to any heaters.  I discussed with him regarding supportive wound care.  We will give him podiatry follow-up.  We will give him prescription for antibiotics.  I discussed with patient that if the redness starts spreading, he can start the antibiotics otherwise hold off until he sees podiatry.  Patient had no known drug allergies. At this time, patient exhibits no emergent life-threatening condition that require further evaluation in ED. Discussed patient with Dr. Anitra Lauth. At this time, patient exhibits  no emergent life-threatening condition that require further evaluation in ED. Patient had ample opportunity for questions and discussion. All patient's questions were answered with full understanding. Strict return precautions  discussed. Patient expresses understanding and agreement to plan.   Portions of this note were generated with Scientist, clinical (histocompatibility and immunogenetics)Dragon dictation software. Dictation errors may occur despite best attempts at proofreading.  Final Clinical Impression(s) / ED Diagnoses Final diagnoses:  Blister of foot, unspecified laterality, initial encounter  Ulcer of toe of right foot, unspecified ulcer stage (HCC)    Rx / DC Orders ED Discharge Orders         Ordered    cephALEXin (KEFLEX) 500 MG capsule  4 times daily        07/19/20 1032    mupirocin ointment (BACTROBAN) 2 %  3 times daily        07/19/20 1032           Rosana HoesLayden, Meghana Tullo A, PA-C 07/19/20 1550    Gwyneth SproutPlunkett, Whitney, MD 07/19/20 2122

## 2020-07-19 NOTE — ED Notes (Signed)
Pt transported to Xray. 

## 2020-07-19 NOTE — Discharge Instructions (Signed)
As we discussed, if the redness starts spreading or gets worse, start the antibiotics.  Make sure you are applying the ointment to the wounds and keeping them covered.  It is important that you follow-up with the referred foot doctor.  Call their office on Monday to arrange an appointment.  If at any point, the symptoms get worse, you have fevers, you have numbness, discoloration of the feet, return to emergency department for any worsening or concerning symptoms.

## 2020-07-26 DIAGNOSIS — E11621 Type 2 diabetes mellitus with foot ulcer: Secondary | ICD-10-CM | POA: Insufficient documentation

## 2020-07-30 ENCOUNTER — Ambulatory Visit: Payer: 59 | Admitting: Podiatry

## 2020-07-30 ENCOUNTER — Ambulatory Visit (INDEPENDENT_AMBULATORY_CARE_PROVIDER_SITE_OTHER): Payer: 59

## 2020-07-30 ENCOUNTER — Other Ambulatory Visit: Payer: Self-pay

## 2020-07-30 DIAGNOSIS — M79672 Pain in left foot: Secondary | ICD-10-CM | POA: Diagnosis not present

## 2020-07-30 DIAGNOSIS — M79671 Pain in right foot: Secondary | ICD-10-CM | POA: Diagnosis not present

## 2020-07-30 DIAGNOSIS — T25229A Burn of second degree of unspecified foot, initial encounter: Secondary | ICD-10-CM

## 2020-07-30 NOTE — Progress Notes (Signed)
   HPI: 45 y.o. male presenting today as a new patient for evaluation of blisters that developed over the past week to the patient's feet.  Patient states that it was a sudden onset.  He noticed something in his shoes and when he took his shoes off he noticed blisters to the bilateral feet.  He went to an urgent care where he was prescribed an oral and topical antibiotic.  Over the past week there has been significant improvement.  He presents for further treatment evaluation  Past Medical History:  Diagnosis Date  . Diabetes (HCC)   . GERD (gastroesophageal reflux disease)   . Hyperlipidemia   . Hypertension      Physical Exam: General: The patient is alert and oriented x3 in no acute distress.  Dermatology: Skin is warm, dry and supple bilateral lower extremities. Negative for open lesions or macerations.  Very superficial second-degree blisters noted to the bilateral toes with loss of the epidermis.  Overall there appears to be significant improvement.  There is no drainage or erythema or edema around the areas.  Vascular: Palpable pedal pulses bilaterally. No edema or erythema noted. Capillary refill within normal limits.  Neurological: Epicritic and protective threshold grossly intact bilaterally.   Musculoskeletal Exam: Range of motion within normal limits to all pedal and ankle joints bilateral. Muscle strength 5/5 in all groups bilateral.   Assessment: 1.  Superficial blisters bilateral feet; resolving   Plan of Care:  1. Patient evaluated. 2.  Although I am unsure what oral and topical antibiotics were prescribed they seem to be significantly improving the patient's feet.  Continue. 3.  The blisters are mostly resolved.  Return to clinic as needed      Felecia Shelling, DPM [facility]  Dr. Felecia Shelling, DPM    [address] [place], [place] [address]                Office [phone]  Fax [phone]

## 2022-01-21 IMAGING — CR DG FOOT COMPLETE 3+V*L*
3 series · 3 of 3 positions shown · non-contrast
Comparison: None.

CLINICAL DATA: Diabetic foot ulcer

EXAM:
LEFT FOOT - COMPLETE 3+ VIEW

[foot ap]
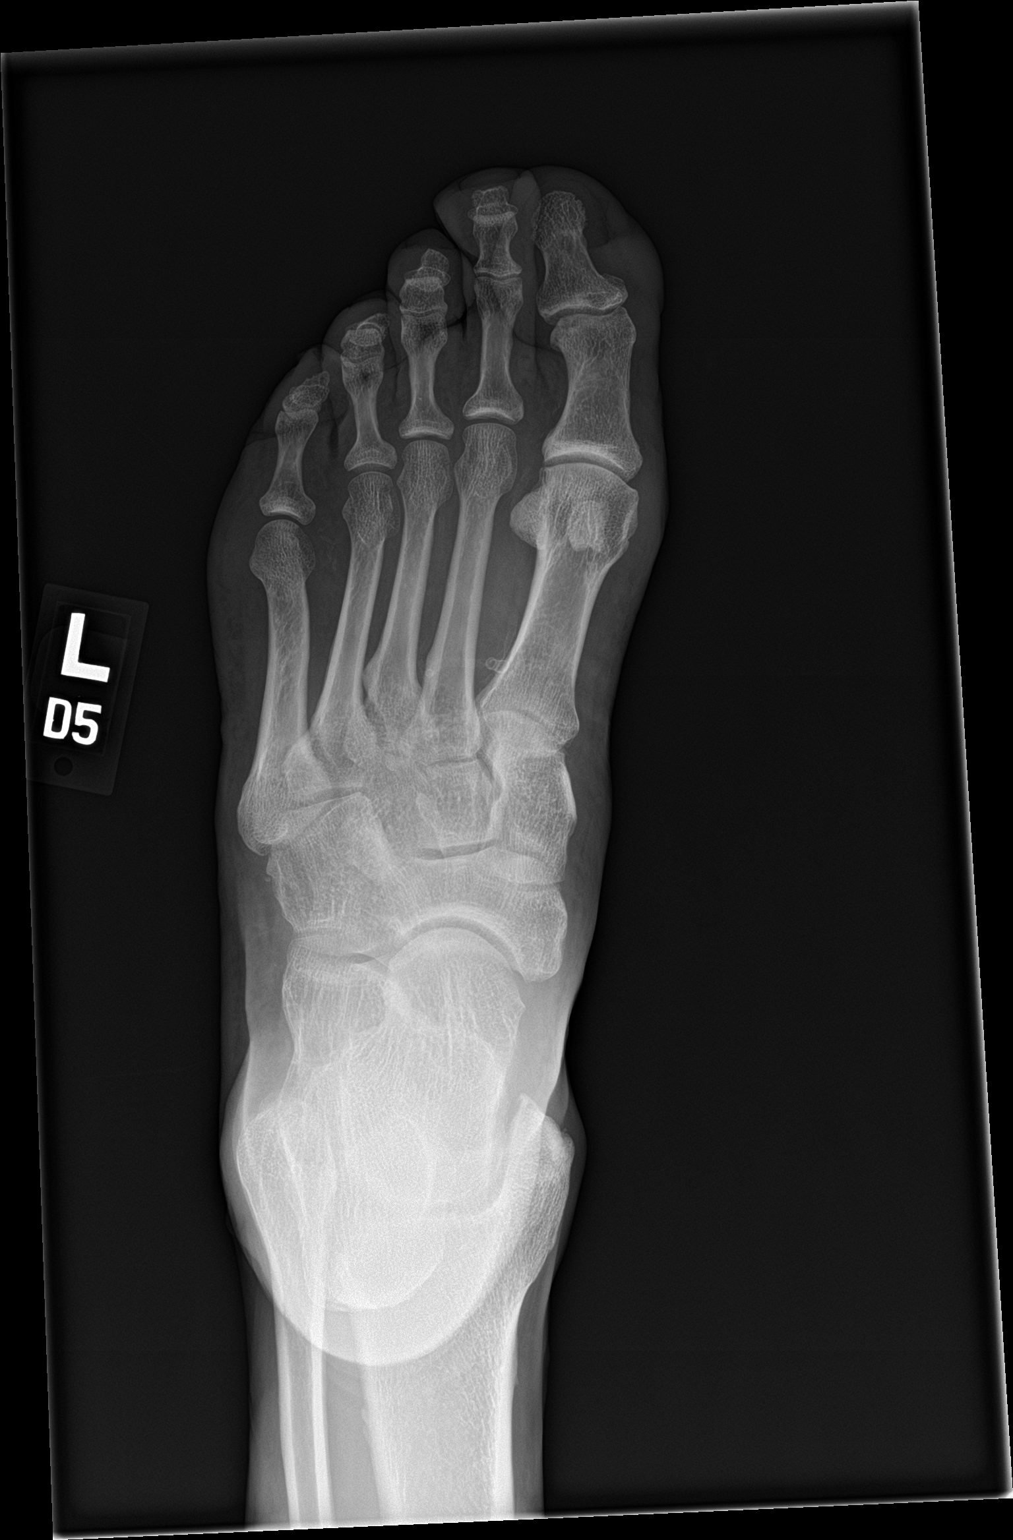

[foot obl]
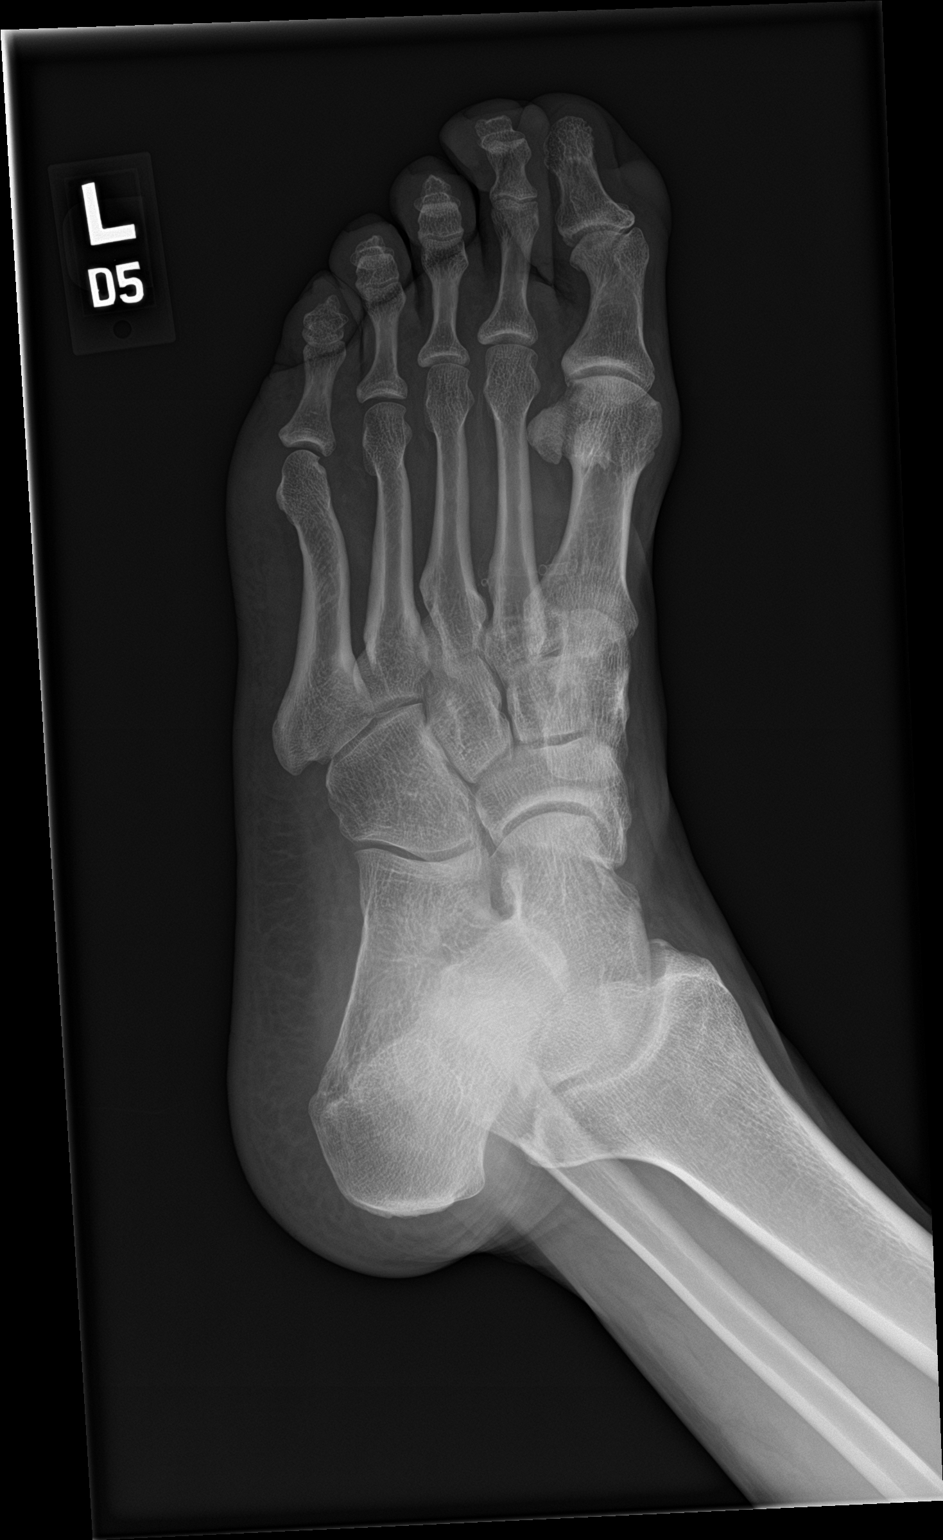

[foot lat]
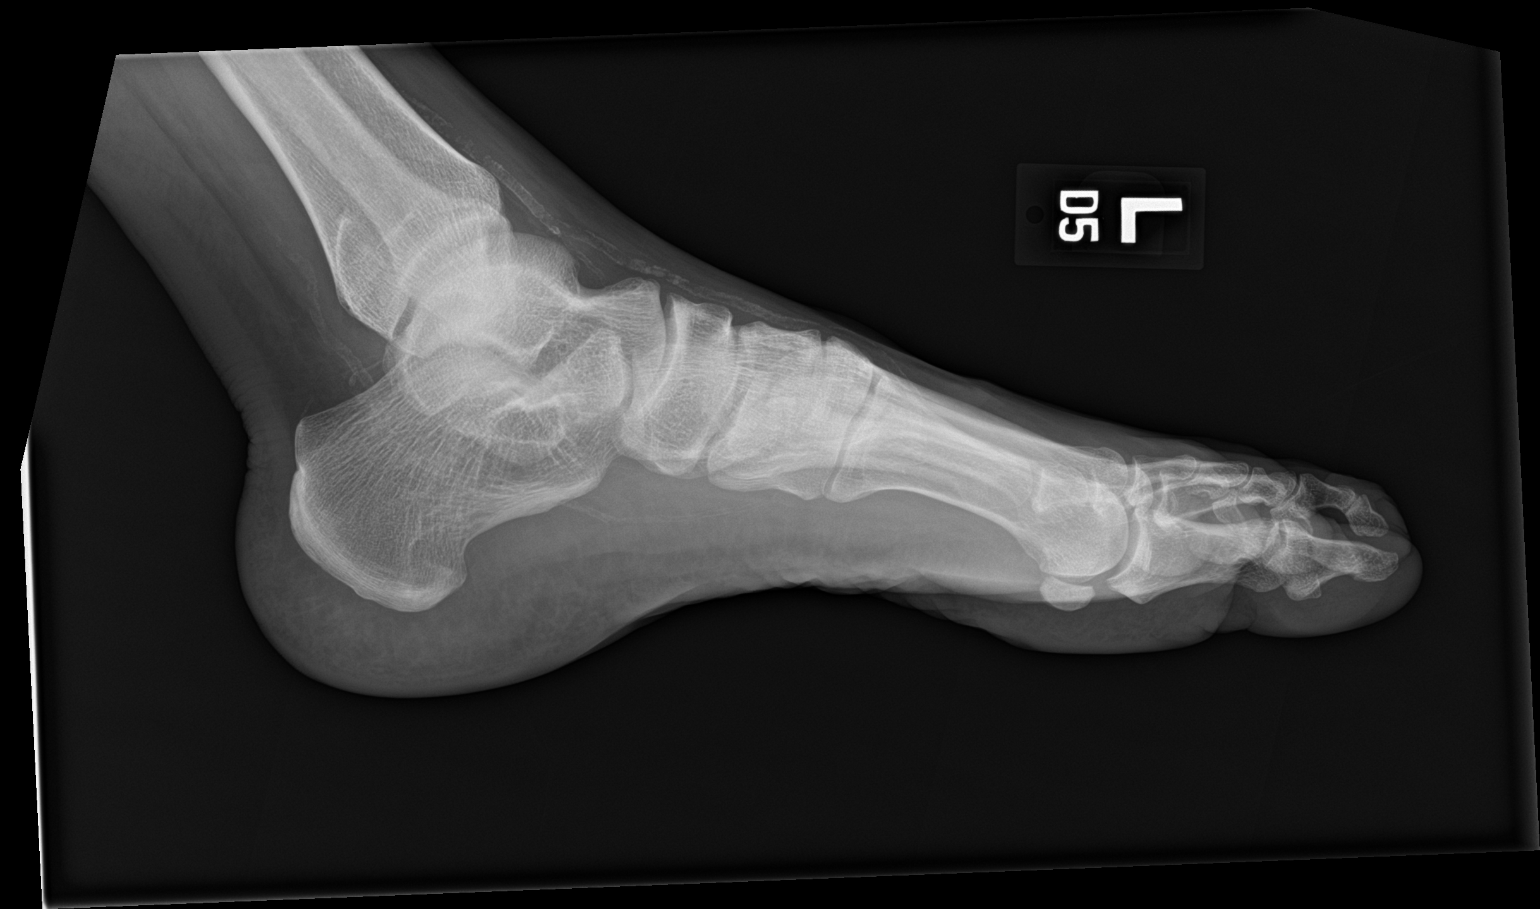

[3 of 3 positions shown; findings below may reference images not displayed]

FINDINGS: The patient's ulcer is not clearly visualized. No opaque foreign
body or erosion. Premature arterial calcification correlating with
history of diabetes.
IMPRESSION: No acute finding. No opaque foreign body or evidence of
osteomyelitis.

## 2022-01-21 IMAGING — CR DG FOOT COMPLETE 3+V*R*
3 series · 3 of 3 positions shown · non-contrast
Comparison: None.

CLINICAL DATA: Diabetic foot ulcer

EXAM:
RIGHT FOOT COMPLETE - 3+ VIEW

[foot ap]
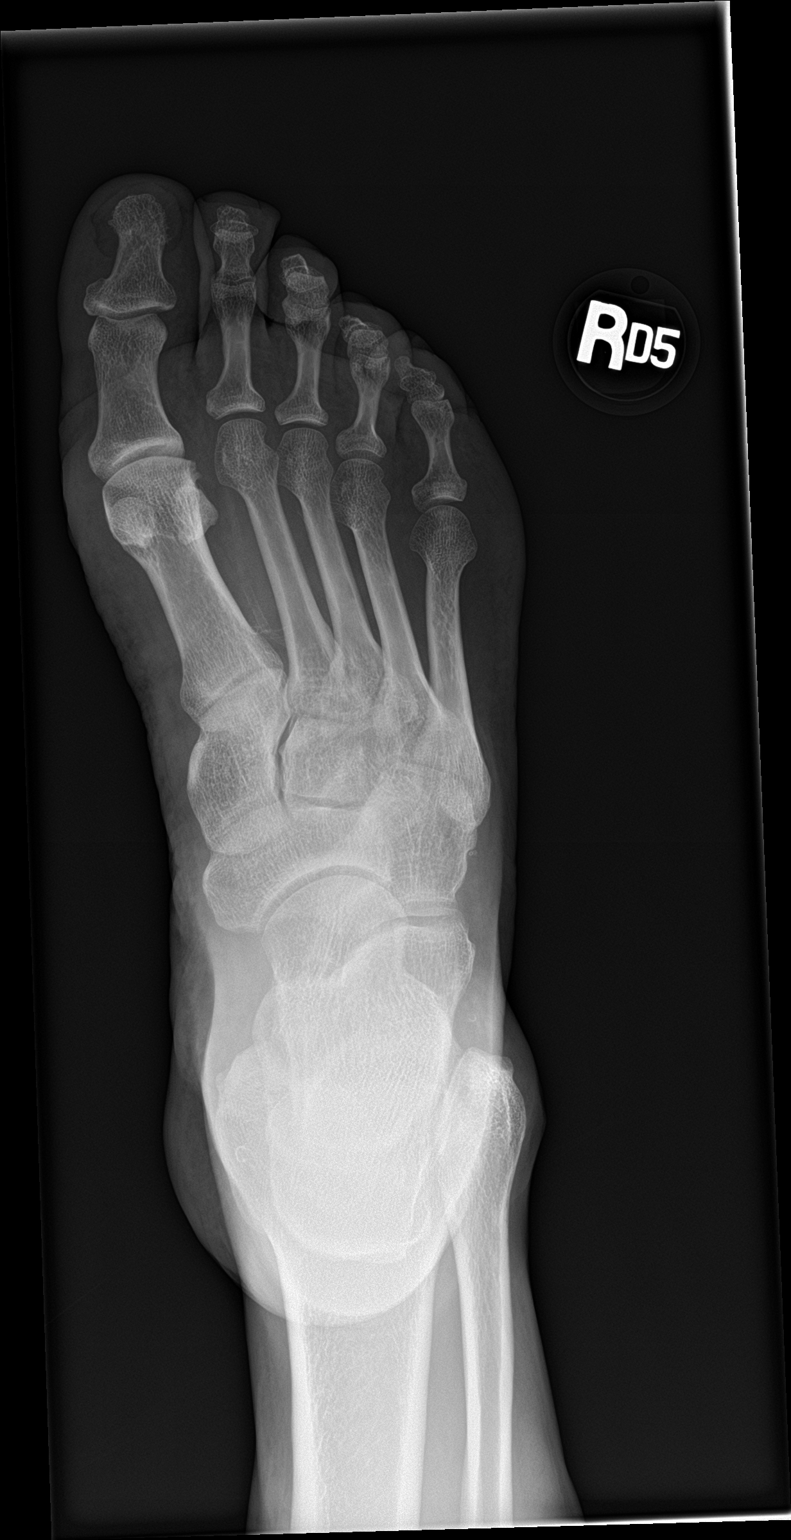

[foot obl]
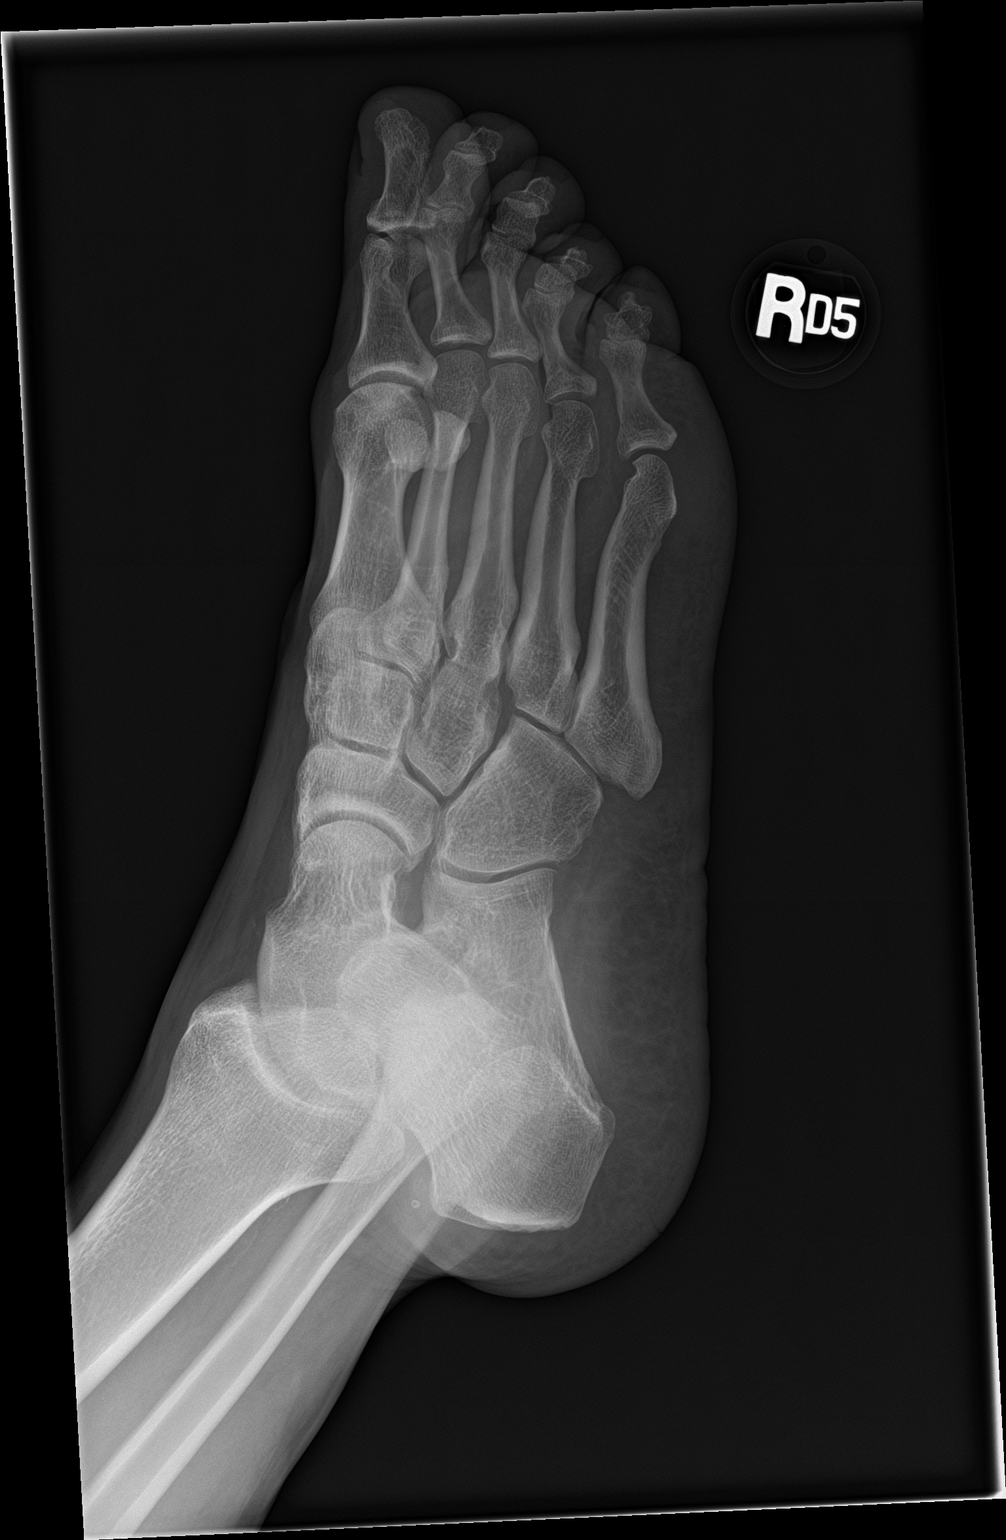

[foot lat]
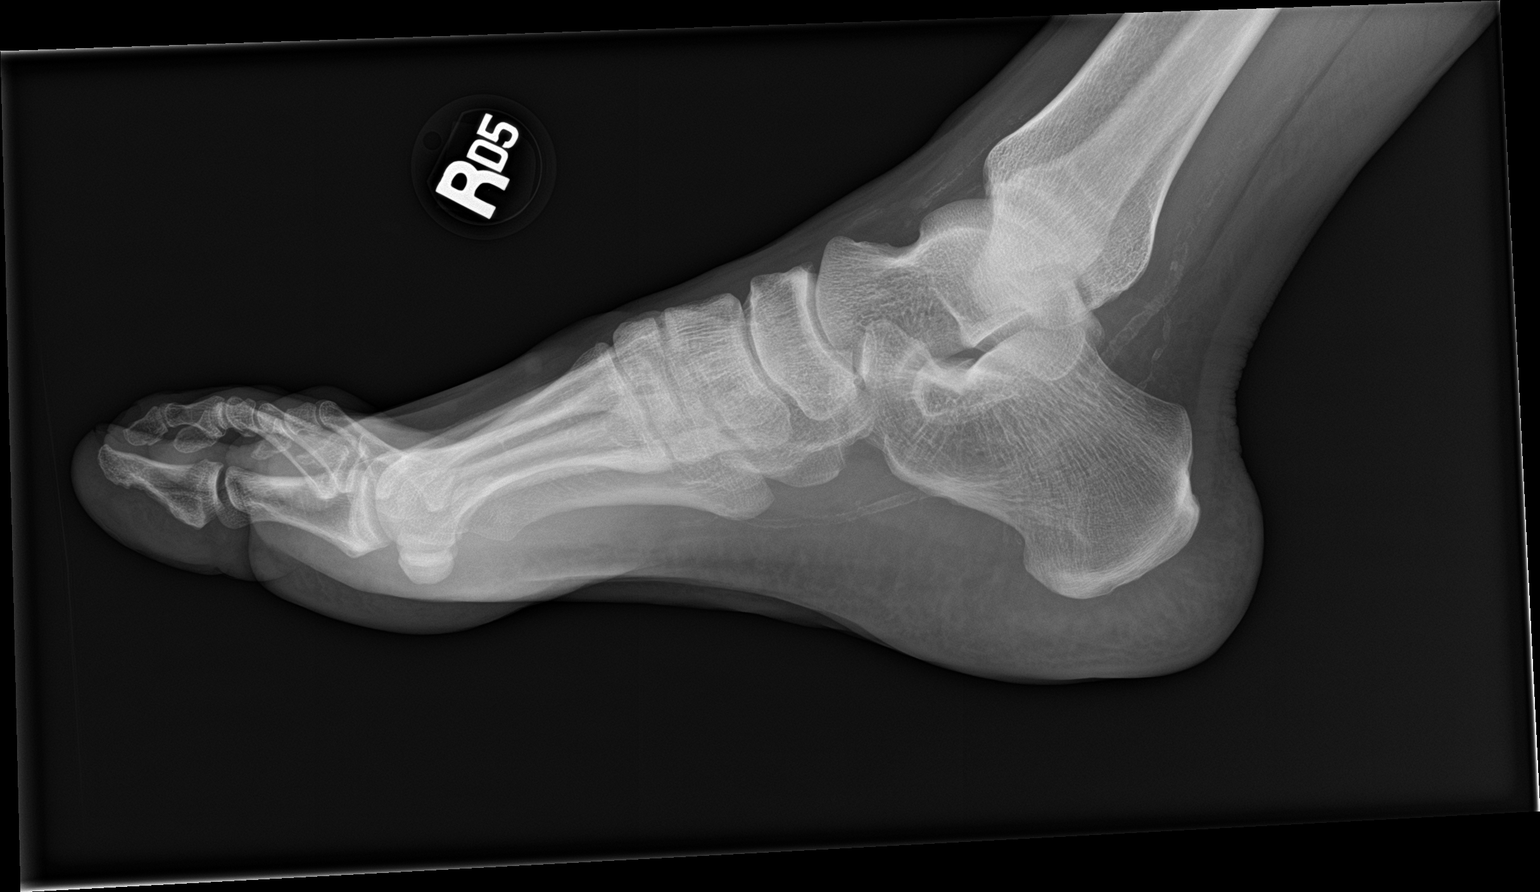

[3 of 3 positions shown; findings below may reference images not displayed]

FINDINGS: The patient's ulcer is not clearly visualized. No evidence of
osteomyelitis, fracture, or necrotizing soft tissue infection.
Premature arterial calcification correlating with the history.
IMPRESSION: No acute finding.

## 2023-10-10 ENCOUNTER — Ambulatory Visit: Payer: Self-pay | Admitting: Podiatry

## 2023-11-04 ENCOUNTER — Encounter: Payer: Self-pay | Admitting: Advanced Practice Midwife

## 2023-11-09 ENCOUNTER — Ambulatory Visit (INDEPENDENT_AMBULATORY_CARE_PROVIDER_SITE_OTHER): Payer: Self-pay

## 2023-11-09 ENCOUNTER — Ambulatory Visit (INDEPENDENT_AMBULATORY_CARE_PROVIDER_SITE_OTHER): Payer: Self-pay | Admitting: Podiatry

## 2023-11-09 DIAGNOSIS — E119 Type 2 diabetes mellitus without complications: Secondary | ICD-10-CM

## 2023-11-09 DIAGNOSIS — G629 Polyneuropathy, unspecified: Secondary | ICD-10-CM

## 2023-11-09 NOTE — Progress Notes (Signed)
   Chief Complaint  Patient presents with   Foot Pain    Left foot pain in the arch. No morning pain, but it hurts at the end of the day, he has been using diabetic insoles. No recent A1c.     HPI: 48 y.o. male PMHx T2DM presenting today for routine diabetic foot exam.  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  He generally has no pain or tenderness when he wears the supportive shoes with diabetic insoles.  He has some occasional tenderness when he massages his feet in the evenings.  No new complaints  Past Medical History:  Diagnosis Date   Diabetes (HCC)    GERD (gastroesophageal reflux disease)    Hyperlipidemia    Hypertension     Past Surgical History:  Procedure Laterality Date   UPPER GASTROINTESTINAL ENDOSCOPY  08/03/2019   05/2019    No Known Allergies   Physical Exam: General: The patient is alert and oriented x3 in no acute distress.  Dermatology: Skin is warm, dry and supple bilateral lower extremities.   Vascular: Palpable pedal pulses bilaterally. Capillary refill within normal limits.  No appreciable edema.  No erythema.  Neurological: Light touch and protective threshold diminished bilateral  Musculoskeletal Exam: No pedal deformities noted  Radiographic exam LT foot 11/09/2023: Normal osseous mineralization.  Joint spaces preserved.  Impression: Negative  Assessment/Plan of Care: 1.  Encounter for diabetic foot exam  -Patient evaluated -Comprehensive diabetic foot exam performed today -Continue management with PCP for diabetes control -Continue wearing good supportive shoes with prefabricated diabetic insoles -Return to clinic PRN      Thresa EMERSON Sar, DPM [facility]  Dr. Thresa EMERSON Sar, DPM    [address] [place], [place] [address]                Office [phone]  Fax [phone]
